# Patient Record
Sex: Male | Born: 1935 | Race: White | Hispanic: No | Marital: Married
Health system: Southern US, Community
[De-identification: ages and names within clinical notes are randomized; demographics above are authoritative.]

---

## 2020-02-16 ENCOUNTER — Inpatient Hospital Stay
Admission: AD | Admit: 2020-02-16 | Payer: Self-pay | Source: Other Acute Inpatient Hospital | Admitting: Internal Medicine

## 2020-03-07 ENCOUNTER — Emergency Department: Payer: Medicare Other

## 2020-03-07 ENCOUNTER — Emergency Department
Admission: EM | Admit: 2020-03-07 | Discharge: 2020-04-06 | Disposition: E | Payer: Medicare Other | Attending: Student in an Organized Health Care Education/Training Program | Admitting: Student in an Organized Health Care Education/Training Program

## 2020-03-07 DIAGNOSIS — Z20822 Contact with and (suspected) exposure to covid-19: Secondary | ICD-10-CM | POA: Diagnosis not present

## 2020-03-07 DIAGNOSIS — Z79899 Other long term (current) drug therapy: Secondary | ICD-10-CM | POA: Diagnosis not present

## 2020-03-07 DIAGNOSIS — R6521 Severe sepsis with septic shock: Secondary | ICD-10-CM | POA: Diagnosis not present

## 2020-03-07 DIAGNOSIS — F0390 Unspecified dementia without behavioral disturbance: Secondary | ICD-10-CM | POA: Diagnosis not present

## 2020-03-07 DIAGNOSIS — A419 Sepsis, unspecified organism: Secondary | ICD-10-CM

## 2020-03-07 DIAGNOSIS — R109 Unspecified abdominal pain: Secondary | ICD-10-CM | POA: Diagnosis not present

## 2020-03-07 DIAGNOSIS — R4020 Unspecified coma: Secondary | ICD-10-CM | POA: Diagnosis present

## 2020-03-07 LAB — CBC WITH DIFFERENTIAL/PLATELET
Abs Immature Granulocytes: 0.34 10*3/uL — ABNORMAL HIGH (ref 0.00–0.07)
Basophils Absolute: 0 10*3/uL (ref 0.0–0.1)
Basophils Relative: 0 %
Eosinophils Absolute: 0 10*3/uL (ref 0.0–0.5)
Eosinophils Relative: 0 %
HCT: 35.4 % — ABNORMAL LOW (ref 39.0–52.0)
Hemoglobin: 11.3 g/dL — ABNORMAL LOW (ref 13.0–17.0)
Immature Granulocytes: 2 %
Lymphocytes Relative: 3 %
Lymphs Abs: 0.5 10*3/uL — ABNORMAL LOW (ref 0.7–4.0)
MCH: 29.7 pg (ref 26.0–34.0)
MCHC: 31.9 g/dL (ref 30.0–36.0)
MCV: 92.9 fL (ref 80.0–100.0)
Monocytes Absolute: 0.3 10*3/uL (ref 0.1–1.0)
Monocytes Relative: 2 %
Neutro Abs: 13.6 10*3/uL — ABNORMAL HIGH (ref 1.7–7.7)
Neutrophils Relative %: 93 %
Platelets: 121 10*3/uL — ABNORMAL LOW (ref 150–400)
RBC: 3.81 MIL/uL — ABNORMAL LOW (ref 4.22–5.81)
RDW: 15.8 % — ABNORMAL HIGH (ref 11.5–15.5)
Smear Review: NORMAL
WBC: 14.7 10*3/uL — ABNORMAL HIGH (ref 4.0–10.5)
nRBC: 0.1 % (ref 0.0–0.2)

## 2020-03-07 LAB — COMPREHENSIVE METABOLIC PANEL
ALT: 26 U/L (ref 0–44)
AST: 79 U/L — ABNORMAL HIGH (ref 15–41)
Albumin: 2 g/dL — ABNORMAL LOW (ref 3.5–5.0)
Alkaline Phosphatase: 86 U/L (ref 38–126)
Anion gap: 16 — ABNORMAL HIGH (ref 5–15)
BUN: 73 mg/dL — ABNORMAL HIGH (ref 8–23)
CO2: 15 mmol/L — ABNORMAL LOW (ref 22–32)
Calcium: 7.9 mg/dL — ABNORMAL LOW (ref 8.9–10.3)
Chloride: 119 mmol/L — ABNORMAL HIGH (ref 98–111)
Creatinine, Ser: 3.94 mg/dL — ABNORMAL HIGH (ref 0.61–1.24)
GFR calc Af Amer: 15 mL/min — ABNORMAL LOW (ref >60)
GFR calc non Af Amer: 13 mL/min — ABNORMAL LOW (ref >60)
Glucose, Bld: 164 mg/dL — ABNORMAL HIGH (ref 70–99)
Potassium: 4 mmol/L (ref 3.5–5.1)
Sodium: 150 mmol/L — ABNORMAL HIGH (ref 135–145)
Total Bilirubin: 2 mg/dL — ABNORMAL HIGH (ref 0.3–1.2)
Total Protein: 4 g/dL — ABNORMAL LOW (ref 6.5–8.1)

## 2020-03-07 LAB — BLOOD GAS, VENOUS
Acid-base deficit: 14.7 mmol/L — ABNORMAL HIGH (ref 0.0–2.0)
Bicarbonate: 12.8 mmol/L — ABNORMAL LOW (ref 20.0–28.0)
FIO2: 100
O2 Saturation: 19 %
Patient temperature: 38
pCO2, Ven: 35 mmHg — ABNORMAL LOW (ref 44.0–60.0)
pH, Ven: 7.16 — CL (ref 7.250–7.430)
pO2, Ven: <31 mmHg — CL (ref 32.0–45.0)

## 2020-03-07 LAB — URINALYSIS, COMPLETE (UACMP) WITH MICROSCOPIC
Bilirubin Urine: NEGATIVE
Glucose, UA: NEGATIVE mg/dL
Ketones, ur: 5 mg/dL — AB
Nitrite: NEGATIVE
Protein, ur: 30 mg/dL — AB
Specific Gravity, Urine: 1.012 (ref 1.005–1.030)
WBC, UA: >50 WBC/hpf — ABNORMAL HIGH (ref 0–5)
pH: 5 (ref 5.0–8.0)

## 2020-03-07 LAB — SARS CORONAVIRUS 2 BY RT PCR (HOSPITAL ORDER, PERFORMED IN ~~LOC~~ HOSPITAL LAB): SARS Coronavirus 2: NEGATIVE

## 2020-03-07 LAB — CK: Total CK: 550 U/L — ABNORMAL HIGH (ref 49–397)

## 2020-03-07 LAB — GLUCOSE, CAPILLARY
Glucose-Capillary: 135 mg/dL — ABNORMAL HIGH (ref 70–99)
Glucose-Capillary: 62 mg/dL — ABNORMAL LOW (ref 70–99)

## 2020-03-07 LAB — LACTIC ACID, PLASMA: Lactic Acid, Venous: 6.3 mmol/L (ref 0.5–1.9)

## 2020-03-07 MED ORDER — SODIUM CHLORIDE 0.9 % IV SOLN
2.0000 g | Freq: Once | INTRAVENOUS | Status: AC
  Administered 2020-03-07: 2 g via INTRAVENOUS
  Filled 2020-03-07: qty 2

## 2020-03-07 MED ORDER — MORPHINE SULFATE (PF) 2 MG/ML IV SOLN
2.0000 mg | INTRAVENOUS | Status: DC | PRN
  Administered 2020-03-07: 2 mg via INTRAVENOUS
  Filled 2020-03-07: qty 1

## 2020-03-07 MED ORDER — VANCOMYCIN HCL IN DEXTROSE 1-5 GM/200ML-% IV SOLN
1000.0000 mg | Freq: Once | INTRAVENOUS | Status: AC
  Administered 2020-03-07: 1000 mg via INTRAVENOUS
  Filled 2020-03-07: qty 200

## 2020-03-07 MED ORDER — SODIUM CHLORIDE 0.9 % IV SOLN: Freq: Once | INTRAVENOUS | Status: AC

## 2020-03-07 MED ORDER — LACTATED RINGERS IV BOLUS
1000.0000 mL | Freq: Once | INTRAVENOUS | Status: AC
  Administered 2020-03-07: 1000 mL via INTRAVENOUS

## 2020-03-07 MED ORDER — NOREPINEPHRINE 4 MG/250ML-% IV SOLN
0.0000 ug/min | INTRAVENOUS | Status: DC
  Administered 2020-03-07: 2 ug/min via INTRAVENOUS
  Filled 2020-03-07: qty 250

## 2020-03-07 MED ORDER — SODIUM CHLORIDE 0.9 % IV BOLUS
1000.0000 mL | Freq: Once | INTRAVENOUS | Status: AC
  Administered 2020-03-07: 1000 mL via INTRAVENOUS

## 2020-03-07 MED ORDER — ACETAMINOPHEN 650 MG RE SUPP
650.0000 mg | Freq: Once | RECTAL | Status: AC
  Administered 2020-03-07: 650 mg via RECTAL
  Filled 2020-03-07: qty 1

## 2020-03-07 MED ORDER — DEXTROSE 50 % IV SOLN
INTRAVENOUS | Status: AC
  Administered 2020-03-07: 50 mL
  Filled 2020-03-07: qty 50

## 2020-03-07 MED ORDER — METRONIDAZOLE IN NACL 5-0.79 MG/ML-% IV SOLN
500.0000 mg | Freq: Once | INTRAVENOUS | Status: AC
  Administered 2020-03-07: 500 mg via INTRAVENOUS
  Filled 2020-03-07: qty 100

## 2020-03-08 LAB — BLOOD CULTURE ID PANEL (REFLEXED)

## 2020-03-10 LAB — URINE CULTURE: Culture: >=100000 — AB

## 2020-03-10 LAB — CULTURE, BLOOD (ROUTINE X 2)

## 2020-04-06 NOTE — Consult Note (Signed)
PHARMACY -  BRIEF ANTIBIOTIC NOTE   Pharmacy has received consult(s) for Unknown source from an ED provider.  The patient's profile has been reviewed for ht/wt/allergies/indication/available labs.    One time order(s) placed for cefepime and vancomycin  Further antibiotics/pharmacy consults should be ordered by admitting physician if indicated.                       Thank you, Ronnald Ramp 30-Mar-2020  1:27 PM

## 2020-04-06 NOTE — ED Provider Notes (Signed)
[facility] Emergency Department Provider Note    First MD Initiated Contact with Patient 2020/03/20 1302     (approximate)  I have reviewed the triage vital signs and the nursing notes.   HISTORY  Chief Complaint unresponsive  Level V Caveat: AMS  HPI Vincent Snyder is a 85 y.o. male reported history of dementia and recent hospitalization at [facility] presents to the ER via EMS from [facility].  According to the charge nurse, the facility called the ER roughly 1 hour ago to notify us that the patient has been minimally responsive for several hours hypotensive and hypoxic.  Patient arrives persistently hypotensive.  Minimally responsive.  Does have spontaneous respirations unable to provide any additional history.  EMS reports that they did note that a liter of fluid had been hung at the facility but had drained into the bed and become detached from a small peripheral IV.    No past medical history on file. No family history on file.  There are no problems to display for this patient.     Prior to Admission medications   Medication Sig Start Date End Date Taking? Authorizing Provider  acetaminophen (TYLENOL) 325 MG tablet Take 325 mg by mouth every 6 (six) hours as needed for mild pain.   Yes [provider]  amLODipine (NORVASC) 10 MG tablet Take 10 mg by mouth daily. 11/16/19  Yes [provider]  aspirin 81 MG chewable tablet Chew 81 mg by mouth 2 (two) times daily.   Yes [provider]  atenolol (TENORMIN) 25 MG tablet Take 25 mg by mouth daily. 11/23/19  Yes [provider]  atorvastatin (LIPITOR) 10 MG tablet Take 10 mg by mouth at bedtime.   Yes [provider]  cholecalciferol (VITAMIN D3) 25 MCG (1000 UNIT) tablet Take 2,000 Units by mouth daily.   Yes [provider]  esomeprazole (NEXIUM) 40 MG capsule Take 40 mg by mouth daily at 12 noon.   Yes [provider]  tamsulosin  (FLOMAX) 0.4 MG CAPS capsule Take 0.4 mg by mouth daily. 01/07/20  Yes [provider]    Allergies Patient has no known allergies.    Social History Social History   Tobacco Use  . Smoking status: Not on file  Substance Use Topics  . Alcohol use: Not on file  . Drug use: Not on file    Review of Systems Patient denies headaches, rhinorrhea, blurry vision, numbness, shortness of breath, chest pain, edema, cough, abdominal pain, nausea, vomiting, diarrhea, dysuria, fevers, rashes or hallucinations unless otherwise stated above in HPI. ____________________________________________   PHYSICAL EXAM:  VITAL SIGNS: Vitals:   2020/03/20 1535 Mar 20, 2020 1610  BP: (!) 56/38 (!) 57/42  Pulse: 81   Resp: 19 (!) 24  Temp:    SpO2: 95%     Constitutional: critically ill appearing, spontaneous respirations Eyes: Conjunctivae are normal. Pupils 48mm and reactive Head: Atraumatic. Nose: No congestion/rhinnorhea. Mouth/Throat: Mucous membranes are dry Neck: No stridor. Painless ROM.  Cardiovascular: Normal rate, regular rhythm. Grossly normal heart sounds.  mottled Respiratory: Normal respiratory effort.  No retractions. Lungs CTAB with coarse bibasilar BS Gastrointestinal: Soft and nontender. No distention. No abdominal bruits. No CVA tenderness. Genitourinary: normal external genitalia Musculoskeletal: No lower extremity tenderness nor edema.  No joint effusions. Neurologic: altered, intact gag reflex, MAE spontaenously as BP improves Skin:  Skin is warm, dry and intact. No rash noted. Psychiatric: unable to assess  ____________________________________________   LABS (  all labs ordered are listed, but only abnormal results are displayed)  Results for orders placed or performed during the hospital encounter of 03/20/2020 (from the past 24 hour(s))  Glucose, capillary     Status: Abnormal   Collection Time: 03/20/2020 12:52 PM  Result Value Ref Range   Glucose-Capillary 62 (L)  70 - 99 mg/dL  Blood gas, venous     Status: Abnormal   Collection Time: 03/20/2020  1:06 PM  Result Value Ref Range   FIO2 100.00    Delivery systems NON-REBREATHER OXYGEN MASK    pH, Ven 7.16 (LL) 7.25 - 7.43   pCO2, Ven 35 (L) 44 - 60 mmHg   pO2, Ven <31.0 (LL) 32 - 45 mmHg   Bicarbonate 12.8 (L) 20.0 - 28.0 mmol/L   Acid-base deficit 14.7 (H) 0.0 - 2.0 mmol/L   O2 Saturation 19.0 %   Patient temperature 38.0    Collection site VENOUS    Sample type VENOUS   Glucose, capillary     Status: Abnormal   Collection Time: 03/20/2020  1:19 PM  Result Value Ref Range   Glucose-Capillary 135 (H) 70 - 99 mg/dL  Lactic acid, plasma     Status: Abnormal   Collection Time: 03/20/2020  1:21 PM  Result Value Ref Range   Lactic Acid, Venous 6.3 (HH) 0.5 - 1.9 mmol/L  Comprehensive metabolic panel     Status: Abnormal   Collection Time: 03/20/2020  1:21 PM  Result Value Ref Range   Sodium 150 (H) 135 - 145 mmol/L   Potassium 4.0 3.5 - 5.1 mmol/L   Chloride 119 (H) 98 - 111 mmol/L   CO2 15 (L) 22 - 32 mmol/L   Glucose, Bld 164 (H) 70 - 99 mg/dL   BUN 73 (H) 8 - 23 mg/dL   Creatinine, Ser 1.323.94 (H) 0.61 - 1.24 mg/dL   Calcium 7.9 (L) 8.9 - 10.3 mg/dL   Total Protein 4.0 (L) 6.5 - 8.1 g/dL   Albumin 2.0 (L) 3.5 - 5.0 g/dL   AST 79 (H) 15 - 41 U/L   ALT 26 0 - 44 U/L   Alkaline Phosphatase 86 38 - 126 U/L   Total Bilirubin 2.0 (H) 0.3 - 1.2 mg/dL   GFR calc non Af Amer 13 (L) >60 mL/min   GFR calc Af Amer 15 (L) >60 mL/min   Anion gap 16 (H) 5 - 15  CBC WITH DIFFERENTIAL     Status: Abnormal   Collection Time: 03/20/2020  1:21 PM  Result Value Ref Range   WBC 14.7 (H) 4.0 - 10.5 K/uL   RBC 3.81 (L) 4.22 - 5.81 MIL/uL   Hemoglobin 11.3 (L) 13.0 - 17.0 g/dL   HCT 44.035.4 (L) 39 - 52 %   MCV 92.9 80.0 - 100.0 fL   MCH 29.7 26.0 - 34.0 pg   MCHC 31.9 30.0 - 36.0 g/dL   RDW 10.215.8 (H) 72.511.5 - 36.615.5 %   Platelets 121 (L) 150 - 400 K/uL   nRBC 0.1 0.0 - 0.2 %   Neutrophils Relative % 93 %   Neutro Abs  13.6 (H) 1.7 - 7.7 K/uL   Lymphocytes Relative 3 %   Lymphs Abs 0.5 (L) 0.7 - 4.0 K/uL   Monocytes Relative 2 %   Monocytes Absolute 0.3 0 - 1 K/uL   Eosinophils Relative 0 %   Eosinophils Absolute 0.0 0 - 0 K/uL   Basophils Relative 0 %   Basophils Absolute 0.0 0 -  0 K/uL   WBC Morphology MILD LEFT SHIFT (1-5% METAS, OCC MYELO, OCC BANDS)    RBC Morphology MORPHOLOGY UNREMARKABLE    Smear Review Normal platelet morphology    Immature Granulocytes 2 %   Abs Immature Granulocytes 0.34 (H) 0.00 - 0.07 K/uL  Urinalysis, Complete w Microscopic     Status: Abnormal   Collection Time: 03-30-20  1:21 PM  Result Value Ref Range   Color, Urine YELLOW (A) YELLOW   APPearance CLOUDY (A) CLEAR   Specific Gravity, Urine 1.012 1.005 - 1.030   pH 5.0 5.0 - 8.0   Glucose, UA NEGATIVE NEGATIVE mg/dL   Hgb urine dipstick MODERATE (A) NEGATIVE   Bilirubin Urine NEGATIVE NEGATIVE   Ketones, ur 5 (A) NEGATIVE mg/dL   Protein, ur 30 (A) NEGATIVE mg/dL   Nitrite NEGATIVE NEGATIVE   Leukocytes,Ua MODERATE (A) NEGATIVE   RBC / HPF 6-10 0 - 5 RBC/hpf   WBC, UA >50 (H) 0 - 5 WBC/hpf   Bacteria, UA MANY (A) NONE SEEN   Squamous Epithelial / LPF 0-5 0 - 5  SARS Coronavirus 2 by RT PCR (hospital order, performed in [facility] hospital lab) Nasopharyngeal Nasopharyngeal Swab     Status: None   Collection Time: 30-Mar-2020  1:21 PM   Specimen: Nasopharyngeal Swab  Result Value Ref Range   SARS Coronavirus 2 NEGATIVE NEGATIVE  CK     Status: Abnormal   Collection Time: 03-30-20  1:21 PM  Result Value Ref Range   Total CK 550 (H) 49.0 - 397.0 U/L   ____________________________________________  EKG My review and personal interpretation at Time: 12:53   Indication: hypotension  Rate: 70  Rhythm: sinus Axis: normal Other: normal intervals, no sstemi ____________________________________________  RADIOLOGY  I personally reviewed all radiographic images ordered to evaluate for the above acute complaints  and reviewed radiology reports and findings.  These findings were personally discussed with the patient.  Please see medical record for radiology report.  ____________________________________________   PROCEDURES  Procedure(s) performed:  .Critical Care Performed by: Willy Eddy, MD Authorized by: Willy Eddy, MD   Critical care provider statement:    Critical care time (minutes):  45   Critical care time was exclusive of:  Separately billable procedures and treating other patients   Critical care was necessary to treat or prevent imminent or life-threatening deterioration of the following conditions:  Sepsis, renal failure and metabolic crisis   Critical care was time spent personally by me on the following activities:  Development of treatment plan with patient or surrogate, discussions with consultants, evaluation of patient's response to treatment, examination of patient, obtaining history from patient or surrogate, ordering and performing treatments and interventions, ordering and review of laboratory studies, ordering and review of radiographic studies, pulse oximetry, re-evaluation of patient's condition and review of old charts      Critical Care performed: yes ____________________________________________   INITIAL IMPRESSION / ASSESSMENT AND PLAN / ED COURSE  Pertinent labs & imaging results that were available during my care of the patient were reviewed by me and considered in my medical decision making (see chart for details).   DDX: Dehydration, sepsis, pna, uti, hypoglycemia, cva, drug effect, withdrawal, encephalitis   Vincent Snyder is a 85 y.o. who presents to the ED with altered mental status critically ill-appearing as described above.  Was preparing for intubation room for the patient was DNR/DNI and the patient started coming around after IV fluids.  Suspect a large component of this  is metabolic probably do suspect severe sepsis.  Will order blood  cultures as well as IV antibiotics will discuss with family.  Was also noted to have episode of hypoglycemia only mild likely secondary to sepsis.  He is protecting his airway with intact gag reflex.  He is satting well with nonrebreather.  We will continue medical treatment and resuscitation.  Clinical Course as of Mar 07 1849  Fri 04-01-2020  1317 Patient's blood pressure improved with IV hydration.  Is having somewhat of a productive cough.  Is requiring supplemental oxygen.  Several attempts made to call family contact listed to determine CODE STATUS and goals of care.   [PR]  1346 Patient's medical power of attorney was reached and confirms that the patient is DNR/DNI but does agree to IV fluid and IV antibiotic but would not want any heroic or invasive procedures performed.   [PR]  1349 Blood pressure now hypertensive.  Satting 100% on nasal cannula.  Heart rate is actually 87 despite documentation of 150.  Is picking up intermittent tremor likely secondary to his fever.   [PR]  1411 Patient blood pressure now dropping persistently hypotensive.  Will order norepinephrine.   [PR]  1534 Work-up does show evidence of urosepsis.  He is requiring off and on vasopressors but continue with IV fluids and resuscitation.  His reassessment shows improved cap refill.  Skin is warm no longer mottled.  Family updated at bedside.  I did consult with intensivist, Dr. Jayme Cloud who recommends admission to stepdown given his DNR/DNI status for hospitalist admission and she will consult.   [PR]  1712 Family was at bedside and after long discussion regarding the patient's prognosis dementia declining status as his stated goals were comfort family elected to withdraw care transition to comfort measures only.   [PR]  1730 Patient examined with no pulse no spontaneous respirations.  Time of death at 58.  Support provided to family.   [PR]  1755 ME McClendon agrees to release the body as he has PCP at [facility]   [PR]      Clinical Course User Index [PR] Willy Eddy, MD    The patient was evaluated in Emergency Department today for the symptoms described in the history of present illness. He/she was evaluated in the context of the global COVID-19 pandemic, which necessitated consideration that the patient might be at risk for infection with the SARS-CoV-2 virus that causes COVID-19. Institutional protocols and algorithms that pertain to the evaluation of patients at risk for COVID-19 are in a state of rapid change based on information released by regulatory bodies including the CDC and federal and state organizations. These policies and algorithms were followed during the patient's care in the ED.  As part of my medical decision making, I reviewed the following data within the electronic MEDICAL RECORD NUMBER Nursing notes reviewed and incorporated, Labs reviewed, notes from prior ED visits and [place] Controlled Substance Database   ____________________________________________   FINAL CLINICAL IMPRESSION(S) / ED DIAGNOSES  Final diagnoses:  Sepsis with acute hypoxic respiratory failure and septic shock, due to unspecified organism [facility])      NEW MEDICATIONS STARTED DURING THIS VISIT:  New Prescriptions   No medications on file     Note:  This document was prepared using Dragon voice recognition software and may include unintentional dictation errors.    Willy Eddy, MD 2020/04/01 1850

## 2020-04-06 NOTE — ED Notes (Signed)
[facility] contacted by this RN who spoke with [name] and given reference number [phone]. Pt to be considered for tissue donation only. This RN also called and left a message for [name] Chilton Greathouse to inform her of patient's death. Funeral home release form completed by [name] and witnessed by this RN.

## 2020-04-06 NOTE — Progress Notes (Signed)
   03-17-2020 1740  Clinical Encounter Type  Visited With Patient;Family  Visit Type Initial;Spiritual support;Social support  Referral From Nurse  Consult/Referral To Neysa Hotter provided support to the family.

## 2020-04-06 NOTE — ED Notes (Signed)
EDP at bedside to speak with family about patient's status.

## 2020-04-06 DEATH — deceased

## 2022-02-04 IMAGING — CT CT ABD-PELV W/O CM
2 of 4 series · 16 of 46 positions shown, 18 images · non-contrast
Comparison: None.

CLINICAL DATA: Diverticulitis suspected, unresponsive, septic

EXAM:
CT ABDOMEN AND PELVIS WITHOUT CONTRAST
TECHNIQUE: Multidetector CT imaging of the abdomen and pelvis was performed
following the standard protocol without IV contrast.

[Series 2: routine abd/pel wo · axial · 0.73mm/px · z∈[-436,-21]mm · 13 of 91 slices shown, 15 images]
[im 4/91  soft-tissue]
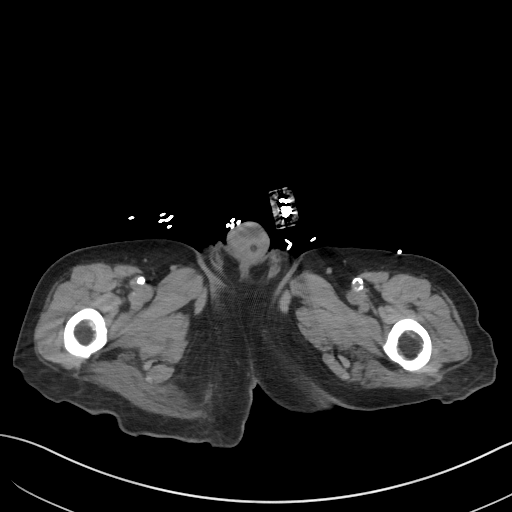
[im 4/91  bone]
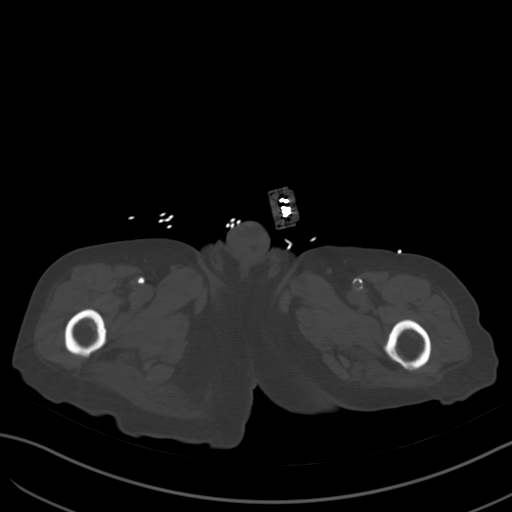
[im 11/91  soft-tissue]
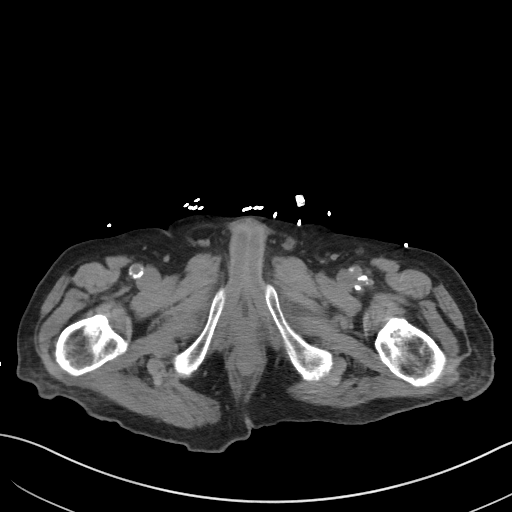
[im 19/91  soft-tissue]
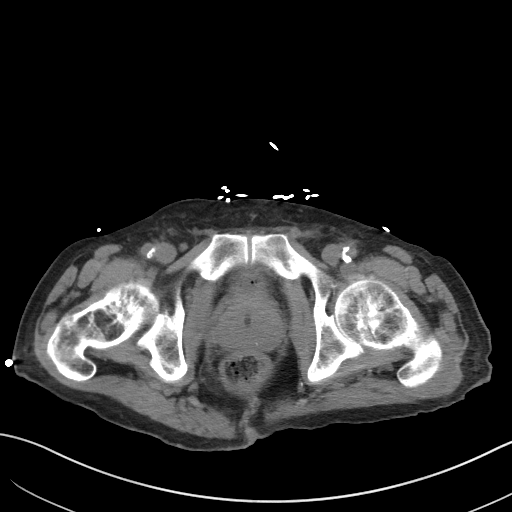
[im 26/91  soft-tissue]
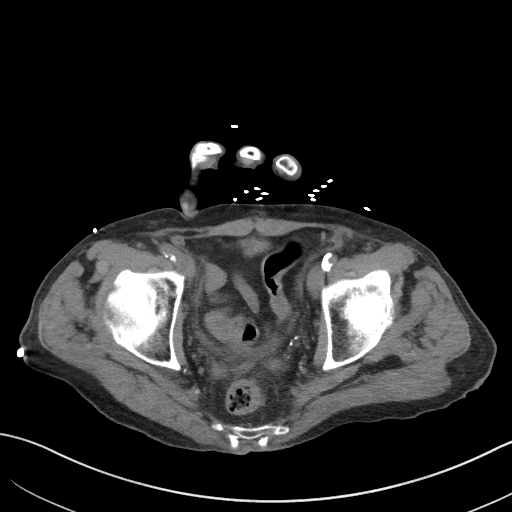
[im 33/91  soft-tissue]
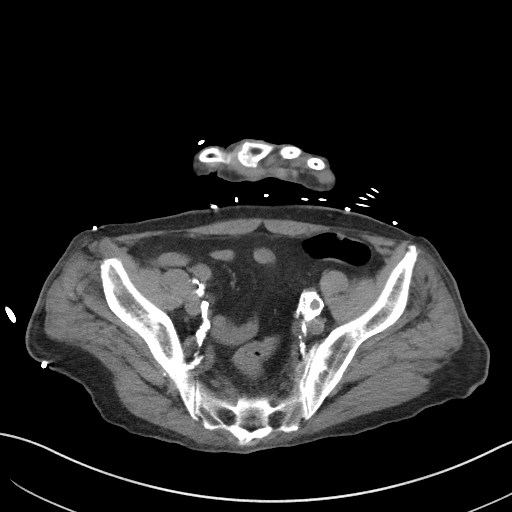
[im 40/91  soft-tissue]
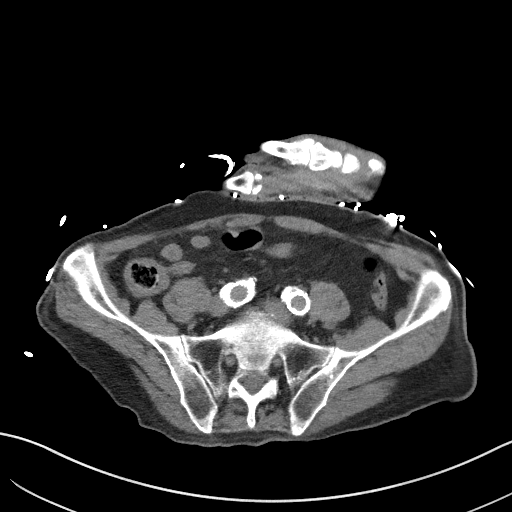
[im 47/91  soft-tissue]
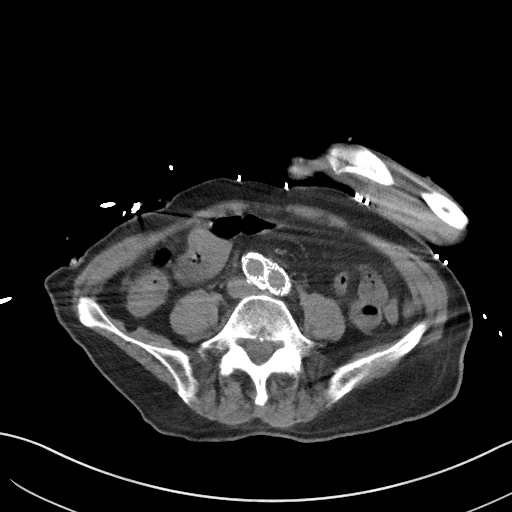
[im 51/91  soft-tissue]
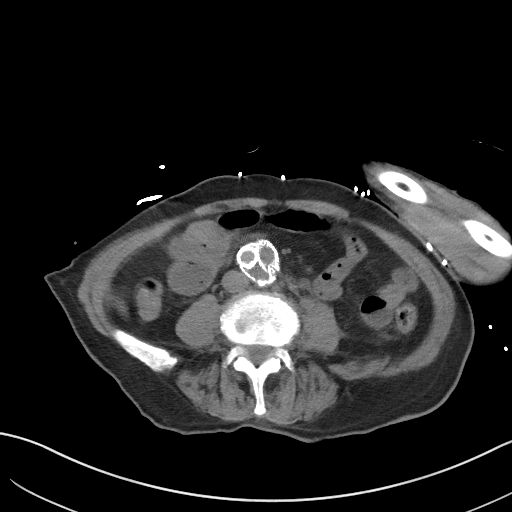
[im 58/91  soft-tissue]
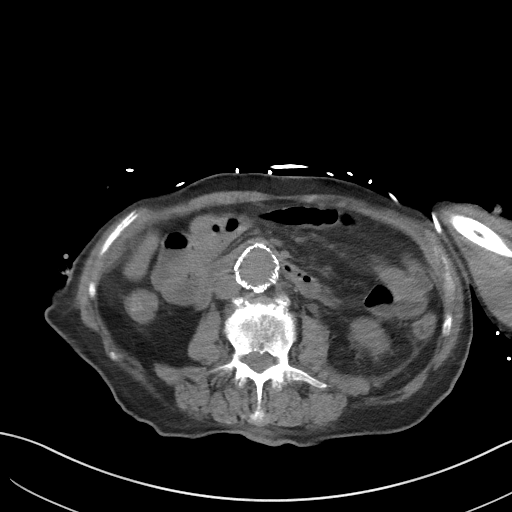
[im 58/91  bone]
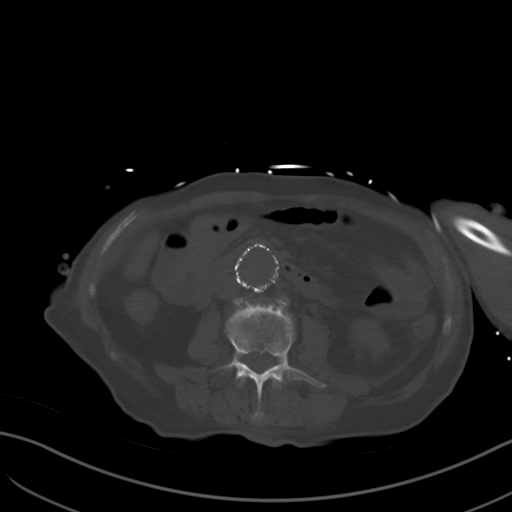
[im 65/91  soft-tissue]
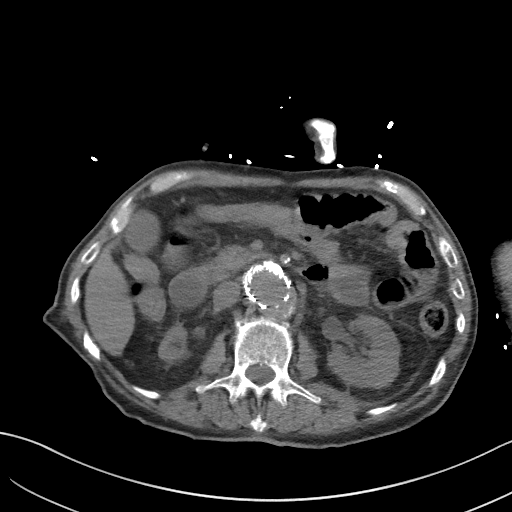
[im 73/91  soft-tissue]
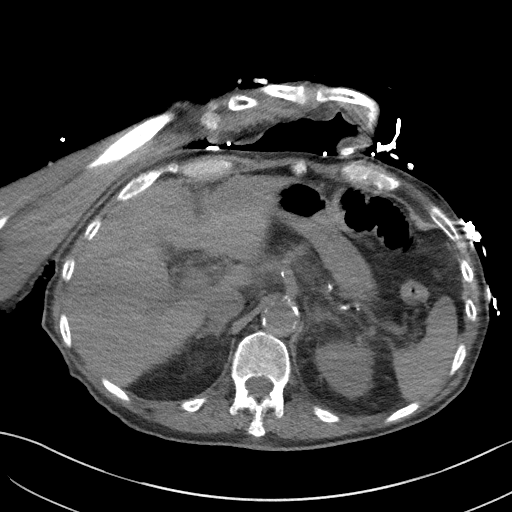
[im 80/91  soft-tissue]
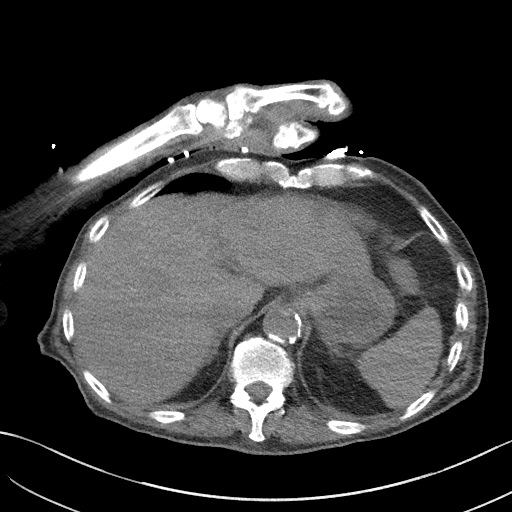
[im 87/91  soft-tissue]
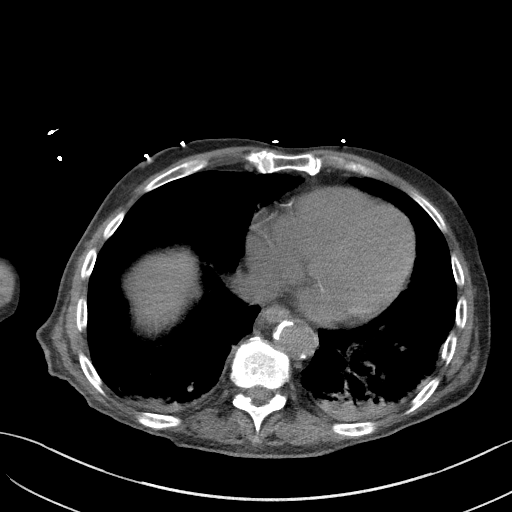

[Series 5: coronal st · coronal · 0.67mm/px · 3 of 75 slices shown]
[im 25/75  soft-tissue]
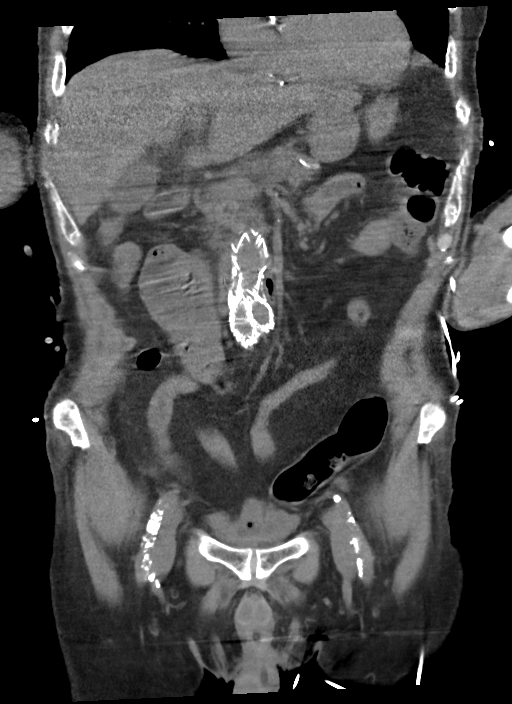
[im 33/75  soft-tissue]
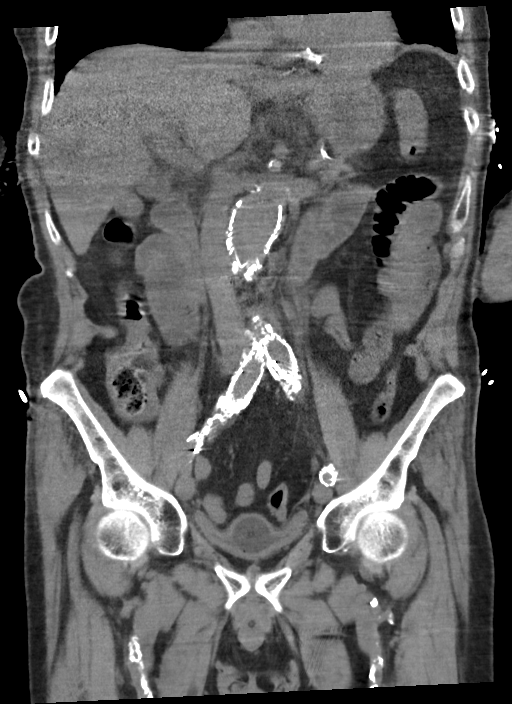
[im 42/75  soft-tissue]
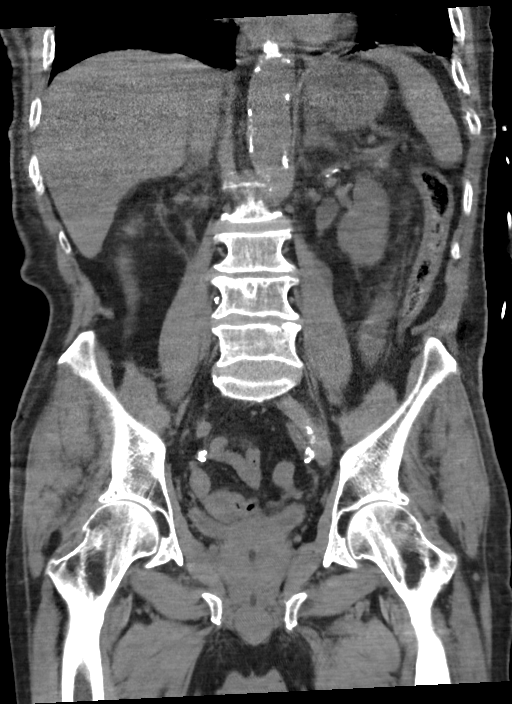

[16 of 46 positions shown; findings below may reference images not displayed]

FINDINGS: Lower chest: Bibasilar atelectasis. Coronary artery and valvular
calcifications. Heart is normal size. No effusions.

Hepatobiliary: No focal hepatic abnormality. Gallbladder
unremarkable.

Pancreas: No focal abnormality or ductal dilatation.

Spleen: Small calcification in the inferior aspect of the spleen,
stable, likely old granulomatous disease. Normal size.

Adrenals/Urinary Tract: Atrophic right kidney, stable. No renal or
adrenal mass. No hydronephrosis. Urinary bladder decompressed with
Foley catheter in place.

Stomach/Bowel: Normal appendix. Stomach, large and small bowel
grossly unremarkable. No diverticulosis or diverticulitis.

Vascular/Lymphatic: Prior stent graft repair of infrarenal abdominal
aortic aneurysm. No adenopathy.

Reproductive: Prominent prostate.

Other: No free fluid or free air.

Musculoskeletal: No acute bony abnormality
IMPRESSION: No evidence of diverticulosis or diverticulitis.

Prior stent graft repair of AAA.

Bibasilar atelectasis.

Aortic atherosclerosis, coronary artery disease.

No acute findings.

## 2022-02-04 IMAGING — CT CT HEAD W/O CM
3 series · 15 of 47 positions shown, 18 images · non-contrast
Comparison: 02/13/2020

CLINICAL DATA: Septic, unresponsive.

EXAM:
CT HEAD WITHOUT CONTRAST
TECHNIQUE: Contiguous axial images were obtained from the base of the skull
through the vertex without intravenous contrast.

[Series 2: head wo · axial · 0.46mm/px · z∈[-155,-30]mm · 9 of 30 slices shown, 12 images]
[im 3/30  brain]
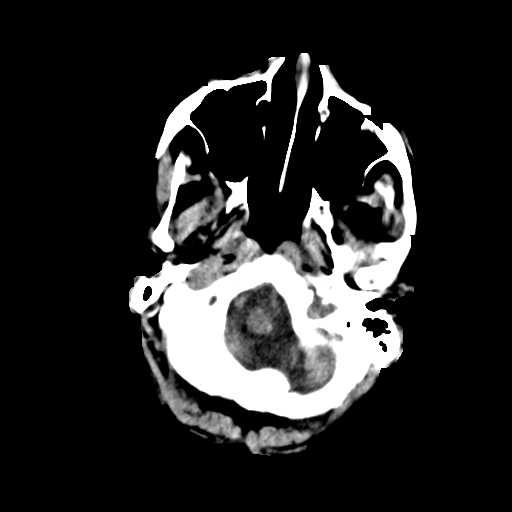
[im 3/30  bone]
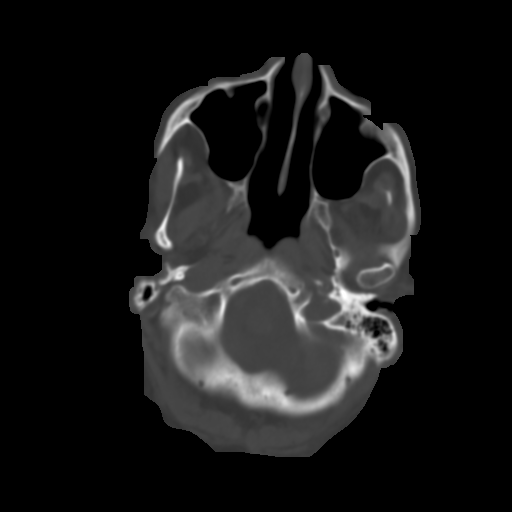
[im 6/30  brain]
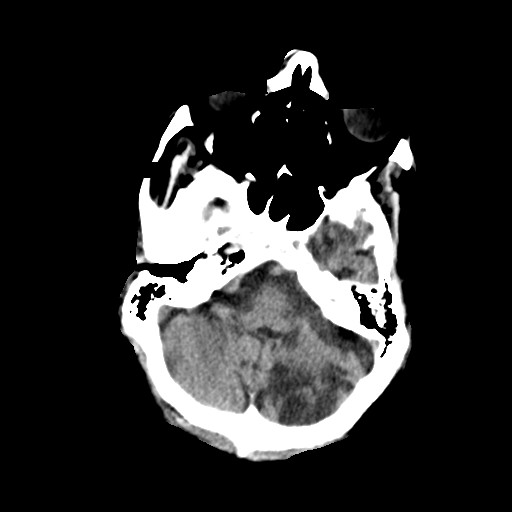
[im 9/30  brain]
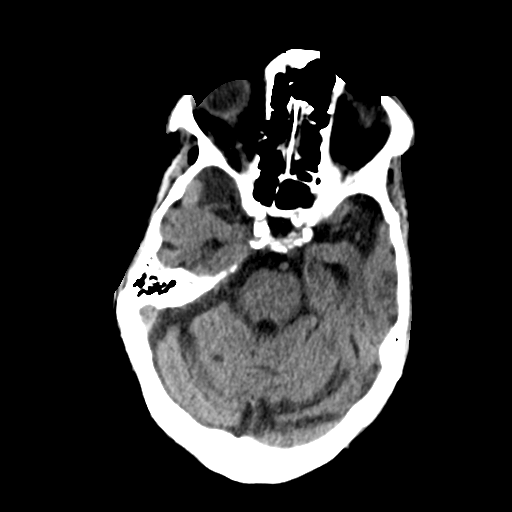
[im 12/30  brain]
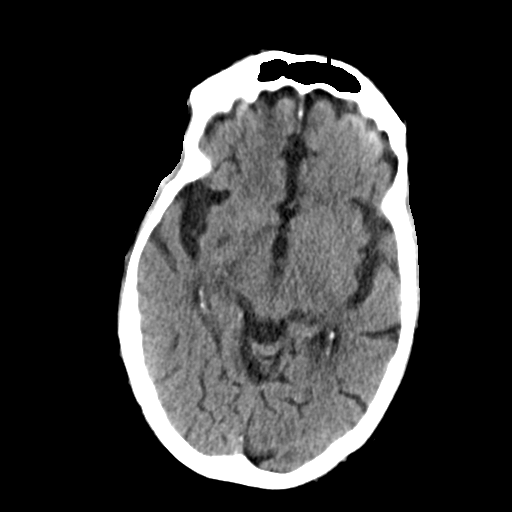
[im 16/30  brain]
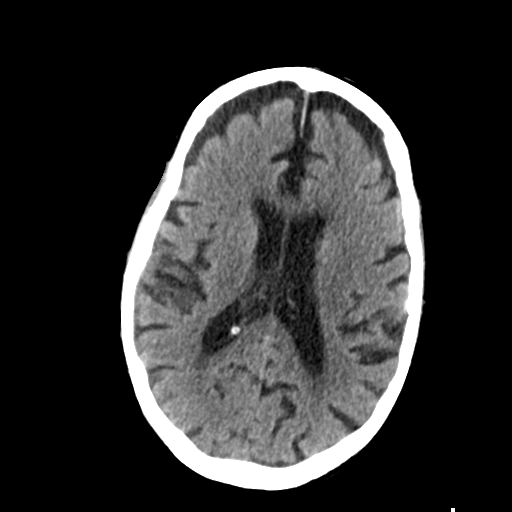
[im 16/30  bone]
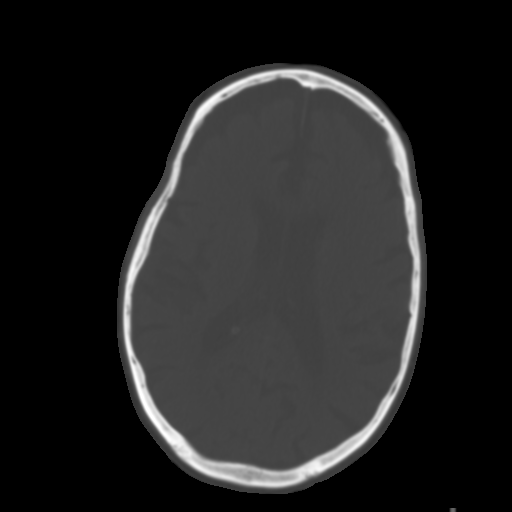
[im 19/30  brain]
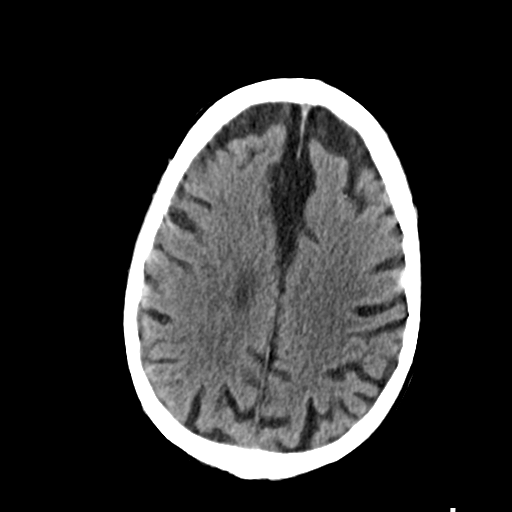
[im 22/30  brain]
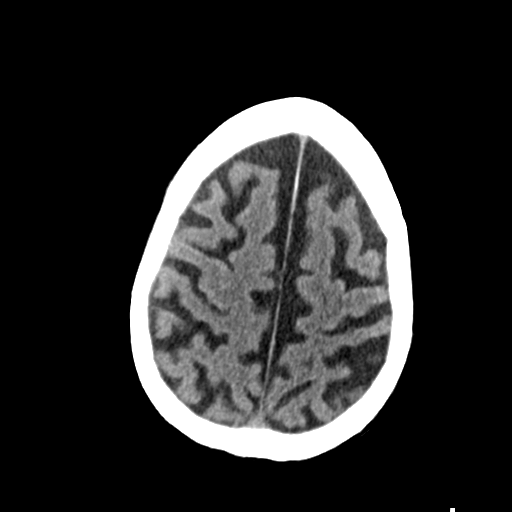
[im 25/30  brain]
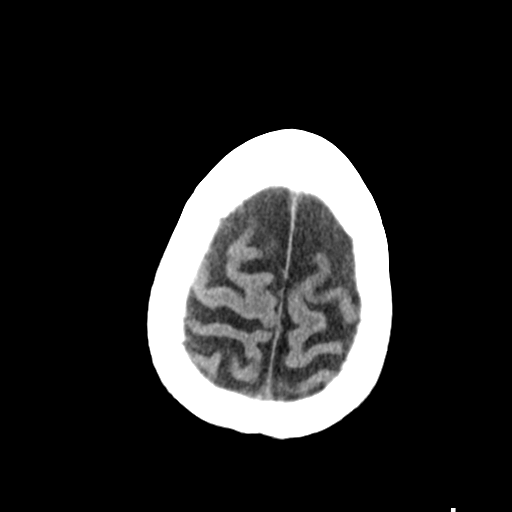
[im 28/30  brain]
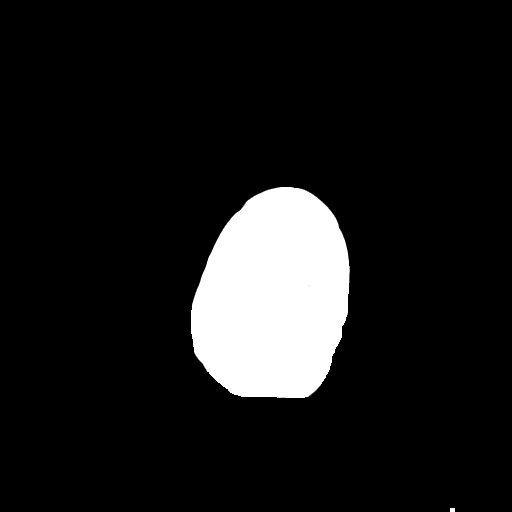
[im 28/30  bone]
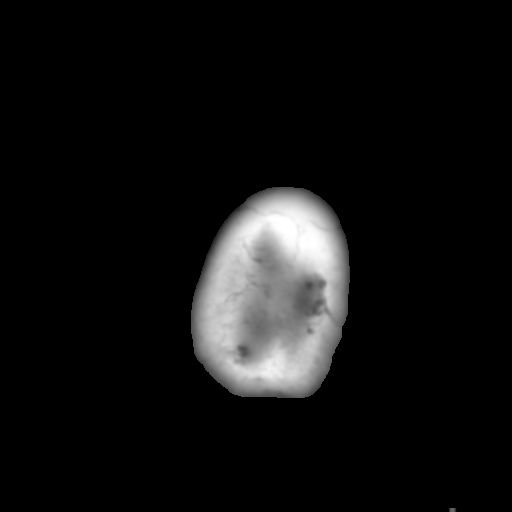

[Series 4: coronal soft tissue · coronal · 0.29mm/px · 3 of 71 slices shown]
[im 24/71  brain]
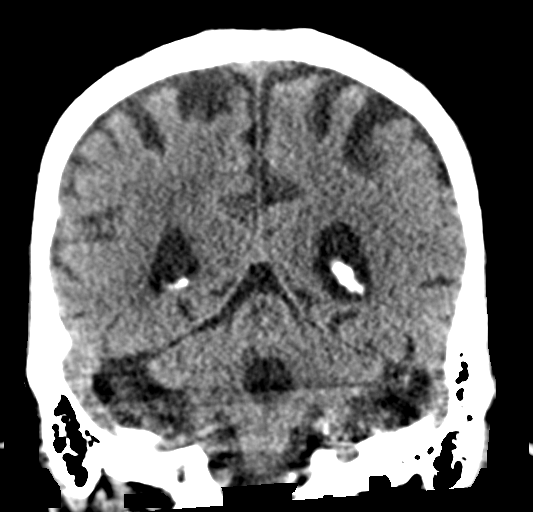
[im 32/71  brain]
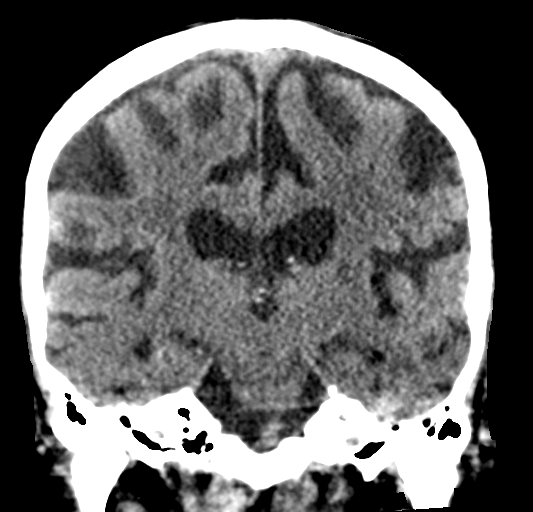
[im 39/71  brain]
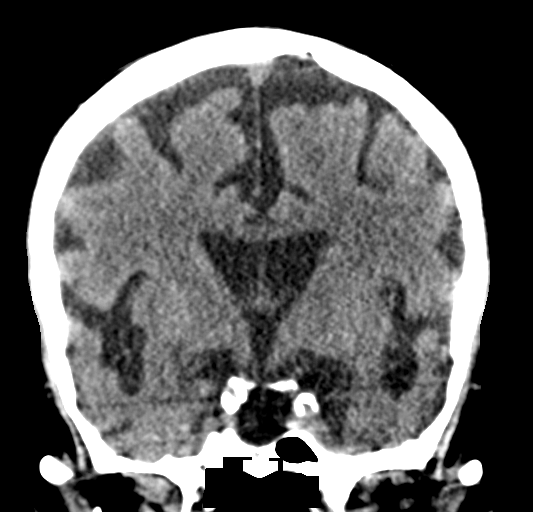

[Series 5: sagittal soft tissue · sagittal · 0.29mm/px · 3 of 55 slices shown]
[im 19/55  brain]
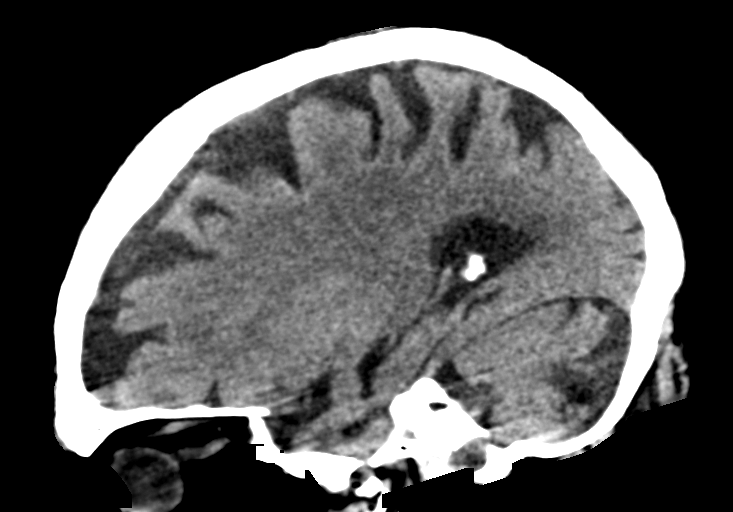
[im 28/55  brain]
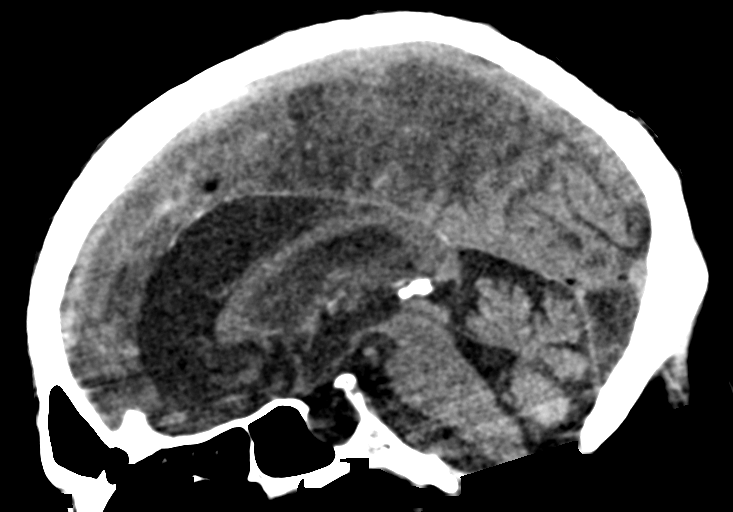
[im 37/55  brain]
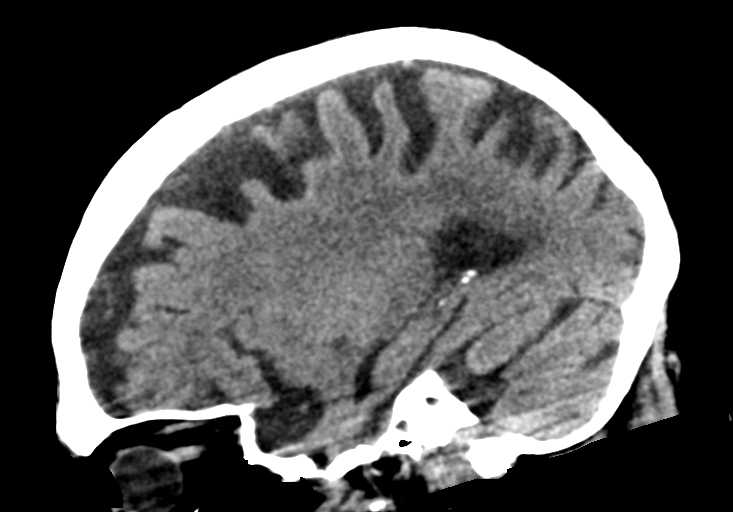

[15 of 47 positions shown; findings below may reference images not displayed]

FINDINGS: Brain: Old bilateral cerebellar infarcts, left greater than right,
stable. There is atrophy and chronic small vessel disease changes.
No acute intracranial abnormality. Specifically, no hemorrhage,
hydrocephalus, mass lesion, acute infarction, or significant
intracranial injury.

Vascular: No hyperdense vessel or unexpected calcification.

Skull: No acute calvarial abnormality.

Sinuses/Orbits: Visualized paranasal sinuses and mastoids clear.
Orbital soft tissues unremarkable.

Other: None
IMPRESSION: Old bilateral cerebellar infarcts.

Atrophy, chronic microvascular disease.

No acute intracranial abnormality.
